# Patient Record
Sex: Male | Born: 1950 | Race: White | Hispanic: No | Marital: Married | Smoking: Never smoker
Health system: Southern US, Community
[De-identification: ages and names within clinical notes are randomized; demographics above are authoritative.]

## PROBLEM LIST (undated history)

## (undated) DIAGNOSIS — E119 Type 2 diabetes mellitus without complications: Secondary | ICD-10-CM

## (undated) DIAGNOSIS — E785 Hyperlipidemia, unspecified: Secondary | ICD-10-CM

## (undated) DIAGNOSIS — M1990 Unspecified osteoarthritis, unspecified site: Secondary | ICD-10-CM

## (undated) DIAGNOSIS — I10 Essential (primary) hypertension: Secondary | ICD-10-CM

## (undated) DIAGNOSIS — K760 Fatty (change of) liver, not elsewhere classified: Secondary | ICD-10-CM

## (undated) HISTORY — DX: Hyperlipidemia, unspecified: E78.5

## (undated) HISTORY — DX: Unspecified osteoarthritis, unspecified site: M19.90

## (undated) HISTORY — DX: Essential (primary) hypertension: I10

## (undated) HISTORY — DX: Fatty (change of) liver, not elsewhere classified: K76.0

## (undated) HISTORY — DX: Type 2 diabetes mellitus without complications: E11.9

---
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
## 2009-05-28 DIAGNOSIS — K760 Fatty (change of) liver, not elsewhere classified: Secondary | ICD-10-CM

## 2009-05-28 HISTORY — DX: Fatty (change of) liver, not elsewhere classified: K76.0

## 2012-03-25 ENCOUNTER — Ambulatory Visit: Payer: Self-pay | Admitting: Internal Medicine

## 2012-06-09 ENCOUNTER — Encounter: Payer: Self-pay | Admitting: Internal Medicine

## 2012-06-09 ENCOUNTER — Ambulatory Visit (INDEPENDENT_AMBULATORY_CARE_PROVIDER_SITE_OTHER): Payer: 59 | Admitting: Internal Medicine

## 2012-06-09 ENCOUNTER — Other Ambulatory Visit (INDEPENDENT_AMBULATORY_CARE_PROVIDER_SITE_OTHER): Payer: 59

## 2012-06-09 ENCOUNTER — Ambulatory Visit (INDEPENDENT_AMBULATORY_CARE_PROVIDER_SITE_OTHER)
Admission: RE | Admit: 2012-06-09 | Discharge: 2012-06-09 | Disposition: A | Discharge status: Home / Self Care | Payer: 59 | Source: Ambulatory Visit | Attending: Internal Medicine | Admitting: Internal Medicine

## 2012-06-09 VITALS — BP 144/90 | HR 86 | Temp 97.8°F | Resp 16 | Ht 72.0 in | Wt 250.0 lb

## 2012-06-09 DIAGNOSIS — I10 Essential (primary) hypertension: Secondary | ICD-10-CM | POA: Insufficient documentation

## 2012-06-09 DIAGNOSIS — M25561 Pain in right knee: Secondary | ICD-10-CM

## 2012-06-09 DIAGNOSIS — M25569 Pain in unspecified knee: Secondary | ICD-10-CM

## 2012-06-09 DIAGNOSIS — Z0000 Encounter for general adult medical examination without abnormal findings: Secondary | ICD-10-CM

## 2012-06-09 LAB — CBC WITH DIFFERENTIAL/PLATELET
Basophils Relative: 0.5 % (ref 0.0–3.0)
Eosinophils Relative: 1.7 % (ref 0.0–5.0)
HCT: 47.2 % (ref 39.0–52.0)
Hemoglobin: 16.1 g/dL (ref 13.0–17.0)
Lymphs Abs: 1.2 10*3/uL (ref 0.7–4.0)
MCV: 89.2 fl (ref 78.0–100.0)
Monocytes Absolute: 0.7 10*3/uL (ref 0.1–1.0)
Monocytes Relative: 9.7 % (ref 3.0–12.0)
RBC: 5.29 Mil/uL (ref 4.22–5.81)
WBC: 7.4 10*3/uL (ref 4.5–10.5)

## 2012-06-09 LAB — URINALYSIS, ROUTINE W REFLEX MICROSCOPIC
Bilirubin Urine: NEGATIVE
Nitrite: NEGATIVE
Total Protein, Urine: NEGATIVE
pH: 6.5 (ref 5.0–8.0)

## 2012-06-09 LAB — COMPREHENSIVE METABOLIC PANEL
Alkaline Phosphatase: 80 U/L (ref 39–117)
BUN: 14 mg/dL (ref 6–23)
Creatinine, Ser: 1 mg/dL (ref 0.4–1.5)
GFR: 82.82 mL/min (ref >60.00)
Glucose, Bld: 84 mg/dL (ref 70–99)
Sodium: 139 mEq/L (ref 135–145)
Total Bilirubin: 0.9 mg/dL (ref 0.3–1.2)

## 2012-06-09 LAB — LIPID PANEL
Cholesterol: 175 mg/dL (ref 0–200)
HDL: 51.7 mg/dL (ref >39.00)
Total CHOL/HDL Ratio: 3
Triglycerides: 224 mg/dL — ABNORMAL HIGH (ref 0.0–149.0)
VLDL: 44.8 mg/dL — ABNORMAL HIGH (ref 0.0–40.0)

## 2012-06-09 MED ORDER — ASPIRIN 81 MG PO TABS: 81.0000 mg | ORAL_TABLET | Freq: Every day | ORAL | Status: AC

## 2012-06-09 MED ORDER — LISINOPRIL-HYDROCHLOROTHIAZIDE 20-12.5 MG PO TABS: 1.0000 | ORAL_TABLET | Freq: Every day | ORAL | Status: DC

## 2012-06-09 MED ORDER — AMLODIPINE BESYLATE 10 MG PO TABS: 10.0000 mg | ORAL_TABLET | Freq: Every day | ORAL | Status: DC

## 2012-06-09 MED ORDER — GLUCOSE BLOOD VI STRP: ORAL_STRIP | Status: DC

## 2012-06-09 NOTE — Assessment & Plan Note (Signed)
I will check his a1c today and will treat if needed He was referred for an eye examd

## 2012-06-09 NOTE — Assessment & Plan Note (Signed)
He has adequate BP control I will check his lytes and renal function today

## 2012-06-09 NOTE — Patient Instructions (Addendum)
Knee Pain The knee is the complex joint between your thigh and your lower leg. It is made up of bones, tendons, ligaments, and cartilage. The bones that make up the knee are:  The femur in the thigh.  The tibia and fibula in the lower leg.  The patella or kneecap riding in the groove on the lower femur. CAUSES  Knee pain is a common complaint with many causes. A few of these causes are:  Injury, such as:  A ruptured ligament or tendon injury.  Torn cartilage.  Medical conditions, such as:  Gout  Arthritis  Infections  Overuse, over training or overdoing a physical activity. Knee pain can be minor or severe. Knee pain can accompany debilitating injury. Minor knee problems often respond well to self-care measures or get well on their own. More serious injuries may need medical intervention or even surgery. SYMPTOMS The knee is complex. Symptoms of knee problems can vary widely. Some of the problems are:  Pain with movement and weight bearing.  Swelling and tenderness.  Buckling of the knee.  Inability to straighten or extend your knee.  Your knee locks and you cannot straighten it.  Warmth and redness with pain and fever.  Deformity or dislocation of the kneecap. DIAGNOSIS  Determining what is wrong may be very straight forward such as when there is an injury. It can also be challenging because of the complexity of the knee. Tests to make a diagnosis may include:  Your caregiver taking a history and doing a physical exam.  Routine X-rays can be used to rule out other problems. X-rays will not reveal a cartilage tear. Some injuries of the knee can be diagnosed by:  Arthroscopy a surgical technique by which a small video camera is inserted through tiny incisions on the sides of the knee. This procedure is used to examine and repair internal knee joint problems. Tiny instruments can be used during arthroscopy to repair the torn knee cartilage (meniscus).  Arthrography  is a radiology technique. A contrast liquid is directly injected into the knee joint. Internal structures of the knee joint then become visible on X-ray film.  An MRI scan is a non x-ray radiology procedure in which magnetic fields and a computer produce two- or three-dimensional images of the inside of the knee. Cartilage tears are often visible using an MRI scanner. MRI scans have largely replaced arthrography in diagnosing cartilage tears of the knee.  Blood work.  Examination of the fluid that helps to lubricate the knee joint (synovial fluid). This is done by taking a sample out using a needle and a syringe. TREATMENT The treatment of knee problems depends on the cause. Some of these treatments are:  Depending on the injury, proper casting, splinting, surgery or physical therapy care will be needed.  Give yourself adequate recovery time. Do not overuse your joints. If you begin to get sore during workout routines, back off. Slow down or do fewer repetitions.  For repetitive activities such as cycling or running, maintain your strength and nutrition.  Alternate muscle groups. For example if you are a weight lifter, work the upper body on one day and the lower body the next.  Either tight or weak muscles do not give the proper support for your knee. Tight or weak muscles do not absorb the stress placed on the knee joint. Keep the muscles surrounding the knee strong.  Take care of mechanical problems.  If you have flat feet, orthotics or special shoes may help.   See your caregiver if you need help.  Arch supports, sometimes with wedges on the inner or outer aspect of the heel, can help. These can shift pressure away from the side of the knee most bothered by osteoarthritis.  A brace called an "unloader" brace also may be used to help ease the pressure on the most arthritic side of the knee.  If your caregiver has prescribed crutches, braces, wraps or ice, use as directed. The acronym for  this is PRICE. This means protection, rest, ice, compression and elevation.  Nonsteroidal anti-inflammatory drugs (NSAID's), can help relieve pain. But if taken immediately after an injury, they may actually increase swelling. Take NSAID's with food in your stomach. Stop them if you develop stomach problems. Do not take these if you have a history of ulcers, stomach pain or bleeding from the bowel. Do not take without your caregiver's approval if you have problems with fluid retention, heart failure, or kidney problems.  For ongoing knee problems, physical therapy may be helpful.  Glucosamine and chondroitin are over-the-counter dietary supplements. Both may help relieve the pain of osteoarthritis in the knee. These medicines are different from the usual anti-inflammatory drugs. Glucosamine may decrease the rate of cartilage destruction.  Injections of a corticosteroid drug into your knee joint may help reduce the symptoms of an arthritis flare-up. They may provide pain relief that lasts a few months. You may have to wait a few months between injections. The injections do have a small increased risk of infection, water retention and elevated blood sugar levels.  Hyaluronic acid injected into damaged joints may ease pain and provide lubrication. These injections may work by reducing inflammation. A series of shots may give relief for as long as 6 months.  Topical painkillers. Applying certain ointments to your skin may help relieve the pain and stiffness of osteoarthritis. Ask your pharmacist for suggestions. Many over the-counter products are approved for temporary relief of arthritis pain.  In some countries, doctors often prescribe topical NSAID's for relief of chronic conditions such as arthritis and tendinitis. A review of treatment with NSAID creams found that they worked as well as oral medications but without the serious side effects. PREVENTION  Maintain a healthy weight. Extra pounds put  more strain on your joints.  Get strong, stay limber. Weak muscles are a common cause of knee injuries. Stretching is important. Include flexibility exercises in your workouts.  Be smart about exercise. If you have osteoarthritis, chronic knee pain or recurring injuries, you may need to change the way you exercise. This does not mean you have to stop being active. If your knees ache after jogging or playing basketball, consider switching to swimming, water aerobics or other low-impact activities, at least for a few days a week. Sometimes limiting high-impact activities will provide relief.  Make sure your shoes fit well. Choose footwear that is right for your sport.  Protect your knees. Use the proper gear for knee-sensitive activities. Use kneepads when playing volleyball or laying carpet. Buckle your seat belt every time you drive. Most shattered kneecaps occur in car accidents.  Rest when you are tired. SEEK MEDICAL CARE IF:  You have knee pain that is continual and does not seem to be getting better.  SEEK IMMEDIATE MEDICAL CARE IF:  Your knee joint feels hot to the touch and you have a high fever. MAKE SURE YOU:   Understand these instructions.  Will watch your condition.  Will get help right away if you are not   doing well or get worse. Document Released: 03/11/2007 Document Revised: 08/06/2011 Document Reviewed: 03/11/2007 [facility] Patient Information 2013 [place], [place]. Health Maintenance, Males A healthy lifestyle and preventative care can promote health and wellness.  Maintain regular health, dental, and eye exams.  Eat a healthy diet. Foods like vegetables, fruits, whole grains, low-fat dairy products, and lean protein foods contain the nutrients you need without too many calories. Decrease your intake of foods high in solid fats, added sugars, and salt. Get information about a proper diet from your caregiver, if necessary.  Regular physical exercise is one of the most  important things you can do for your health. Most adults should get at least 150 minutes of moderate-intensity exercise (any activity that increases your heart rate and causes you to sweat) each week. In addition, most adults need muscle-strengthening exercises on 2 or more days a week.   Maintain a healthy weight. The body mass index (BMI) is a screening tool to identify possible weight problems. It provides an estimate of body fat based on height and weight. Your caregiver can help determine your BMI, and can help you achieve or maintain a healthy weight. For adults 20 years and older:  A BMI below 18.5 is considered underweight.  A BMI of 18.5 to 24.9 is normal.  A BMI of 25 to 29.9 is considered overweight.  A BMI of 30 and above is considered obese.  Maintain normal blood lipids and cholesterol by exercising and minimizing your intake of saturated fat. Eat a balanced diet with plenty of fruits and vegetables. Blood tests for lipids and cholesterol should begin at age 91 and be repeated every 5 years. If your lipid or cholesterol levels are high, you are over 50, or you are a high risk for heart disease, you may need your cholesterol levels checked more frequently.Ongoing high lipid and cholesterol levels should be treated with medicines, if diet and exercise are not effective.  If you smoke, find out from your caregiver how to quit. If you do not use tobacco, do not start.  If you choose to drink alcohol, do not exceed 2 drinks per day. One drink is considered to be 12 ounces (355 mL) of beer, 5 ounces (148 mL) of wine, or 1.5 ounces (44 mL) of liquor.  Avoid use of street drugs. Do not share needles with anyone. Ask for help if you need support or instructions about stopping the use of drugs.  High blood pressure causes heart disease and increases the risk of stroke. Blood pressure should be checked at least every 1 to 2 years. Ongoing high blood pressure should be treated with medicines  if weight loss and exercise are not effective.  If you are 68 to 61 years old, ask your caregiver if you should take aspirin to prevent heart disease.  Diabetes screening involves taking a blood sample to check your fasting blood sugar level. This should be done once every 3 years, after age 63, if you are within normal weight and without risk factors for diabetes. Testing should be considered at a younger age or be carried out more frequently if you are overweight and have at least 1 risk factor for diabetes.  Colorectal cancer can be detected and often prevented. Most routine colorectal cancer screening begins at the age of 58 and continues through age 45. However, your caregiver may recommend screening at an earlier age if you have risk factors for colon cancer. On a yearly basis, your caregiver may provide home  test kits to check for hidden blood in the stool. Use of a small camera at the end of a tube, to directly examine the colon (sigmoidoscopy or colonoscopy), can detect the earliest forms of colorectal cancer. Talk to your caregiver about this at age 44, when routine screening begins. Direct examination of the colon should be repeated every 5 to 10 years through age 7, unless early forms of pre-cancerous polyps or small growths are found.  Hepatitis C blood testing is recommended for all people born from [birth year] through 1964 and any individual with known risks for hepatitis C.  Healthy men should no longer receive prostate-specific antigen (PSA) blood tests as part of routine cancer screening. Consult with your caregiver about prostate cancer screening.  Testicular cancer screening is not recommended for adolescents or adult males who have no symptoms. Screening includes self-exam, caregiver exam, and other screening tests. Consult with your caregiver about any symptoms you have or any concerns you have about testicular cancer.  Practice safe sex. Use condoms and avoid high-risk sexual practices  to reduce the spread of sexually transmitted infections (STIs).  Use sunscreen with a sun protection factor (SPF) of 30 or greater. Apply sunscreen liberally and repeatedly throughout the day. You should seek shade when your shadow is shorter than you. Protect yourself by wearing long sleeves, pants, a wide-brimmed hat, and sunglasses year round, whenever you are outdoors.  Notify your caregiver of new moles or changes in moles, especially if there is a change in shape or color. Also notify your caregiver if a mole is larger than the size of a pencil eraser.  A one-time screening for abdominal aortic aneurysm (AAA) and surgical repair of large AAAs by sound wave imaging (ultrasonography) is recommended for ages 61 to 61 years who are current or former smokers.  Stay current with your immunizations. Document Released: 11/10/2007 Document Revised: 08/06/2011 Document Reviewed: 10/09/2010 [facility] Patient Information 2013 [place], [place].

## 2012-06-09 NOTE — Assessment & Plan Note (Signed)
He refused ALL vaccines today Exam done Labs ordered He was referred for a screening colonoscopy Pt ed material was given

## 2012-06-09 NOTE — Progress Notes (Signed)
Subjective:    Patient ID: Robert Fuentes, male    DOB: November 17, 1950, 62 y.o.   MRN: [id number]  Knee Pain  Incident onset: for 2 months. The incident occurred at home. There was no injury mechanism. The pain is present in the right knee. The quality of the pain is described as aching. The pain is at a severity of 2/10. The pain is mild. The pain has been intermittent since onset. Pertinent negatives include no inability to bear weight, loss of motion, loss of sensation, muscle weakness, numbness or tingling. He reports no foreign bodies present. The symptoms are aggravated by weight bearing. He has tried NSAIDs and acetaminophen for the symptoms. The treatment provided significant relief.  Hypertension This is a chronic problem. The current episode started more than 1 year ago. The problem is controlled. Pertinent negatives include no anxiety, blurred vision, chest pain, headaches, malaise/fatigue, neck pain, orthopnea, palpitations, peripheral edema, PND, shortness of breath or sweats. Agents associated with hypertension include NSAIDs. Past treatments include calcium channel blockers, ACE inhibitors and diuretics. The current treatment provides moderate improvement. Compliance problems include exercise and diet.       Review of Systems  Constitutional: Negative.  Negative for malaise/fatigue.  HENT: Negative.  Negative for neck pain.   Eyes: Negative.  Negative for blurred vision.  Respiratory: Negative for apnea, cough, choking, chest tightness, shortness of breath, wheezing and stridor.   Cardiovascular: Negative for chest pain, palpitations, orthopnea, leg swelling and PND.  Gastrointestinal: Negative for nausea, vomiting, abdominal pain, diarrhea, constipation and blood in stool.  Genitourinary: Negative.   Musculoskeletal: Positive for arthralgias. Negative for myalgias, back pain, joint swelling and gait problem.  Skin: Negative.   Neurological: Negative for tingling, numbness and  headaches.  Hematological: Negative for adenopathy. Does not bruise/bleed easily.  Psychiatric/Behavioral: Negative.        Objective:   Physical Exam  Vitals reviewed. Constitutional: He is oriented to person, place, and time. He appears well-developed and well-nourished. No distress.  HENT:  Head: Normocephalic and atraumatic.  Mouth/Throat: Oropharynx is clear and moist. No oropharyngeal exudate.  Eyes: Conjunctivae normal are normal. Right eye exhibits no discharge. Left eye exhibits no discharge. No scleral icterus.  Neck: Normal range of motion. Neck supple. No JVD present. No tracheal deviation present. No thyromegaly present.  Cardiovascular: Normal rate, regular rhythm, normal heart sounds and intact distal pulses.  Exam reveals no gallop and no friction rub.   No murmur heard. Pulmonary/Chest: Effort normal and breath sounds normal. No stridor. No respiratory distress. He has no wheezes. He has no rales. He exhibits no tenderness.  Abdominal: Soft. Bowel sounds are normal. He exhibits no distension and no mass. There is no tenderness. There is no rebound and no guarding. Hernia confirmed negative in the right inguinal area and confirmed negative in the left inguinal area.  Genitourinary: Rectum normal, testes normal and penis normal. Rectal exam shows no external hemorrhoid, no internal hemorrhoid, no fissure, no mass, no tenderness and anal tone normal. Guaiac negative stool. Prostate is enlarged (1+ smooth symmetrical BPH). Prostate is not tender. Right testis shows no mass, no swelling and no tenderness. Right testis is descended. Left testis shows no mass, no swelling and no tenderness. Left testis is descended. Circumcised. No penile erythema or penile tenderness. No discharge found.  Musculoskeletal: Normal range of motion. He exhibits edema (1+ pitting edema in BLE). He exhibits no tenderness.       Right knee: He exhibits normal range of motion,  no swelling, no effusion, no  ecchymosis, no deformity, no laceration, no erythema, normal alignment, no LCL laxity, normal patellar mobility, no bony tenderness, normal meniscus and no MCL laxity. tenderness found. Medial joint line tenderness noted. No lateral joint line, no MCL, no LCL and no patellar tendon tenderness noted.  Lymphadenopathy:    He has no cervical adenopathy.       Right: No inguinal adenopathy present.       Left: No inguinal adenopathy present.  Neurological: He is oriented to person, place, and time.  Skin: Skin is warm and dry. No rash noted. He is not diaphoretic. No erythema. No pallor.  Psychiatric: He has a normal mood and affect. His behavior is normal. Judgment and thought content normal.      No results found for this basename: WBC, HGB, HCT, PLT, GLUCOSE, CHOL, TRIG, HDL, LDLDIRECT, LDLCALC, ALT, AST, NA, K, CL, CREATININE, BUN, CO2, TSH, PSA, INR, GLUF, HGBA1C, MICROALBUR      Assessment & Plan:

## 2012-06-09 NOTE — Assessment & Plan Note (Signed)
The exam shows concern for the medial meniscus area I will check a plain film to see if there is DJD He will continue the current meds for pain

## 2012-06-10 ENCOUNTER — Encounter: Payer: Self-pay | Admitting: Internal Medicine

## 2012-06-17 ENCOUNTER — Encounter: Payer: Self-pay | Admitting: Internal Medicine

## 2012-06-19 MED ORDER — ACCU-CHEK SOFTCLIX LANCETS MISC: Status: DC

## 2012-06-19 MED ORDER — ACCU-CHEK NANO SMARTVIEW W/DEVICE KIT: 1.0000 | PACK | Freq: Three times a day (TID) | Status: DC

## 2012-10-29 ENCOUNTER — Ambulatory Visit (INDEPENDENT_AMBULATORY_CARE_PROVIDER_SITE_OTHER): Payer: 59 | Admitting: Internal Medicine

## 2012-10-29 ENCOUNTER — Encounter: Payer: Self-pay | Admitting: Internal Medicine

## 2012-10-29 ENCOUNTER — Other Ambulatory Visit (INDEPENDENT_AMBULATORY_CARE_PROVIDER_SITE_OTHER): Payer: 59

## 2012-10-29 VITALS — BP 140/86 | HR 86 | Temp 97.7°F | Resp 16 | Wt 254.5 lb

## 2012-10-29 DIAGNOSIS — M6530 Trigger finger, unspecified finger: Secondary | ICD-10-CM

## 2012-10-29 DIAGNOSIS — I10 Essential (primary) hypertension: Secondary | ICD-10-CM

## 2012-10-29 DIAGNOSIS — M25561 Pain in right knee: Secondary | ICD-10-CM

## 2012-10-29 DIAGNOSIS — M25569 Pain in unspecified knee: Secondary | ICD-10-CM

## 2012-10-29 LAB — BASIC METABOLIC PANEL
CO2: 27 mEq/L (ref 19–32)
Glucose, Bld: 95 mg/dL (ref 70–99)
Potassium: 4.2 mEq/L (ref 3.5–5.1)
Sodium: 142 mEq/L (ref 135–145)

## 2012-10-29 MED ORDER — AMLODIPINE BESYLATE 10 MG PO TABS: 10.0000 mg | ORAL_TABLET | Freq: Every day | ORAL | Status: DC

## 2012-10-29 MED ORDER — LISINOPRIL-HYDROCHLOROTHIAZIDE 20-12.5 MG PO TABS: 1.0000 | ORAL_TABLET | Freq: Every day | ORAL | Status: DC

## 2012-10-29 NOTE — Progress Notes (Signed)
Subjective:    Patient ID: Robert Fuentes, male    DOB: June 16, 1950, 62 y.o.   MRN: [id number]  Arthritis Presents for follow-up visit. He complains of pain and joint swelling. He reports no stiffness or joint warmth. Affected locations include the right knee. His pain is at a severity of 2/10. Pertinent negatives include no fatigue or fever.      Review of Systems  Constitutional: Negative.  Negative for fever, chills, diaphoresis, activity change, appetite change, fatigue and unexpected weight change.  HENT: Negative.   Eyes: Negative.   Respiratory: Negative.  Negative for cough, chest tightness, shortness of breath, wheezing and stridor.   Cardiovascular: Negative.  Negative for chest pain, palpitations and leg swelling.  Gastrointestinal: Negative.  Negative for abdominal pain.  Endocrine: Negative.   Genitourinary: Negative.   Musculoskeletal: Positive for joint swelling, arthralgias and arthritis. Negative for myalgias, back pain, gait problem and stiffness.       Trigger mechanism in left hand middle finger  Skin: Negative.   Allergic/Immunologic: Negative.   Neurological: Negative.   Hematological: Negative.  Negative for adenopathy. Does not bruise/bleed easily.  Psychiatric/Behavioral: Negative.        Objective:   Physical Exam  Vitals reviewed. Constitutional: He is oriented to person, place, and time. He appears well-developed and well-nourished. No distress.  HENT:  Head: Normocephalic and atraumatic.  Mouth/Throat: Oropharynx is clear and moist. No oropharyngeal exudate.  Eyes: Conjunctivae are normal. Right eye exhibits no discharge. Left eye exhibits no discharge. No scleral icterus.  Neck: Normal range of motion. Neck supple. No JVD present. No tracheal deviation present. No thyromegaly present.  Cardiovascular: Normal rate, regular rhythm, normal heart sounds and intact distal pulses.  Exam reveals no gallop and no friction rub.   No murmur  heard. Pulmonary/Chest: Effort normal and breath sounds normal. No stridor. No respiratory distress. He has no wheezes. He has no rales. He exhibits no tenderness.  Abdominal: Soft. Bowel sounds are normal. He exhibits no distension and no mass. There is no tenderness. There is no rebound and no guarding.  Musculoskeletal: Normal range of motion. He exhibits no edema and no tenderness.       Right knee: He exhibits swelling. He exhibits normal range of motion, no effusion, no ecchymosis, no deformity, no laceration, no erythema, normal alignment, no LCL laxity and normal patellar mobility. Tenderness found. Lateral joint line tenderness noted. No medial joint line and no MCL tenderness noted.  +cyst in LIF and trigger in LMF  Lymphadenopathy:    He has no cervical adenopathy.  Neurological: He is oriented to person, place, and time.  Skin: Skin is warm and dry. No rash noted. He is not diaphoretic. No erythema. No pallor.  Psychiatric: He has a normal mood and affect. His behavior is normal. Judgment and thought content normal.     Lab Results  Component Value Date   WBC 7.4 06/09/2012   HGB 16.1 06/09/2012   HCT 47.2 06/09/2012   PLT 128.0* 06/09/2012   GLUCOSE 84 06/09/2012   CHOL 175 06/09/2012   TRIG 224.0* 06/09/2012   HDL 51.70 06/09/2012   LDLDIRECT 82.8 06/09/2012   ALT 40 06/09/2012   AST 32 06/09/2012   NA 139 06/09/2012   K 3.8 06/09/2012   CL 102 06/09/2012   CREATININE 1.0 06/09/2012   BUN 14 06/09/2012   CO2 30 06/09/2012   TSH 0.90 06/09/2012   PSA 2.11 06/09/2012   HGBA1C 5.6 06/09/2012  Assessment & Plan:

## 2012-10-29 NOTE — Assessment & Plan Note (Signed)
His BP is well controlled Today I will check his lytes and renal function 

## 2012-10-29 NOTE — Assessment & Plan Note (Signed)
Ortho referral  

## 2012-10-29 NOTE — Patient Instructions (Signed)

## 2013-04-02 ENCOUNTER — Other Ambulatory Visit: Payer: Self-pay

## 2013-05-11 ENCOUNTER — Other Ambulatory Visit: Payer: Self-pay | Admitting: Internal Medicine

## 2013-07-28 ENCOUNTER — Encounter: Payer: Self-pay | Admitting: Internal Medicine

## 2013-07-28 ENCOUNTER — Ambulatory Visit (INDEPENDENT_AMBULATORY_CARE_PROVIDER_SITE_OTHER): Payer: 59 | Admitting: Internal Medicine

## 2013-07-28 VITALS — BP 140/70 | HR 90 | Temp 97.8°F | Resp 16 | Ht 72.0 in | Wt 253.0 lb

## 2013-07-28 DIAGNOSIS — IMO0001 Reserved for inherently not codable concepts without codable children: Secondary | ICD-10-CM

## 2013-07-28 DIAGNOSIS — I10 Essential (primary) hypertension: Secondary | ICD-10-CM

## 2013-07-28 DIAGNOSIS — E781 Pure hyperglyceridemia: Secondary | ICD-10-CM

## 2013-07-28 DIAGNOSIS — Z0000 Encounter for general adult medical examination without abnormal findings: Secondary | ICD-10-CM

## 2013-07-28 DIAGNOSIS — E1165 Type 2 diabetes mellitus with hyperglycemia: Principal | ICD-10-CM

## 2013-07-28 MED ORDER — VITAMIN E 180 MG (400 UNIT) PO CAPS: 400.0000 [IU] | ORAL_CAPSULE | Freq: Every day | ORAL | Status: AC

## 2013-07-28 MED ORDER — LISINOPRIL-HYDROCHLOROTHIAZIDE 20-12.5 MG PO TABS: ORAL_TABLET | ORAL | Status: AC

## 2013-07-28 MED ORDER — AMLODIPINE BESYLATE 10 MG PO TABS: ORAL_TABLET | ORAL | Status: DC

## 2013-07-28 MED ORDER — GLUCOSE BLOOD VI STRP: ORAL_STRIP | Status: DC

## 2013-07-28 NOTE — Progress Notes (Signed)
Subjective:    Patient ID: Robert Fuentes, male    DOB: 06/15/1950, 63 y.o.   MRN: [id number]  Hypertension This is a chronic problem. The current episode started more than 1 year ago. The problem is unchanged. The problem is controlled. Pertinent negatives include no anxiety, blurred vision, chest pain, headaches, malaise/fatigue, neck pain, orthopnea, palpitations, peripheral edema, PND, shortness of breath or sweats. Past treatments include calcium channel blockers, ACE inhibitors and diuretics. The current treatment provides moderate improvement. Compliance problems include diet and exercise.       Review of Systems  Constitutional: Negative.  Negative for fever, chills, malaise/fatigue, diaphoresis, appetite change and fatigue.  HENT: Negative.   Eyes: Negative.  Negative for blurred vision.  Respiratory: Negative.  Negative for cough, choking, chest tightness, shortness of breath and stridor.   Cardiovascular: Negative.  Negative for chest pain, palpitations, orthopnea, leg swelling and PND.  Gastrointestinal: Negative.  Negative for nausea, vomiting, abdominal pain, diarrhea, constipation and blood in stool.  Endocrine: Negative.   Genitourinary: Negative.   Musculoskeletal: Negative.  Negative for arthralgias, myalgias, neck pain and neck stiffness.  Skin: Negative.   Allergic/Immunologic: Negative.   Neurological: Negative.  Negative for dizziness, weakness and headaches.  Hematological: Negative.  Negative for adenopathy. Does not bruise/bleed easily.  Psychiatric/Behavioral: Negative.        Objective:   Physical Exam  Vitals reviewed. Constitutional: He is oriented to person, place, and time. He appears well-developed and well-nourished. No distress.  HENT:  Head: Normocephalic and atraumatic.  Mouth/Throat: Oropharynx is clear and moist. No oropharyngeal exudate.  Eyes: Conjunctivae are normal. Right eye exhibits no discharge. Left eye exhibits no discharge. No scleral  icterus.  Neck: Normal range of motion. Neck supple. No JVD present. No tracheal deviation present. No thyromegaly present.  Cardiovascular: Normal rate, regular rhythm, normal heart sounds and intact distal pulses.  Exam reveals no gallop and no friction rub.   No murmur heard. Pulmonary/Chest: Effort normal and breath sounds normal. No stridor. No respiratory distress. He has no wheezes. He has no rales. He exhibits no tenderness.  Abdominal: Soft. Bowel sounds are normal. He exhibits no distension and no mass. There is no tenderness. There is no rebound and no guarding. Hernia confirmed negative in the right inguinal area and confirmed negative in the left inguinal area.  Genitourinary: Rectum normal, testes normal and penis normal. Rectal exam shows no external hemorrhoid, no internal hemorrhoid, no fissure, no mass, no tenderness and anal tone normal. Guaiac negative stool. Prostate is enlarged (1+ smooth BPH). Prostate is not tender. Right testis shows no mass, no swelling and no tenderness. Right testis is descended. Left testis shows no mass, no swelling and no tenderness. Left testis is descended. Circumcised. No penile erythema or penile tenderness. No discharge found.  Musculoskeletal: Normal range of motion. He exhibits no edema and no tenderness.  Lymphadenopathy:    He has no cervical adenopathy.       Right: No inguinal adenopathy present.       Left: No inguinal adenopathy present.  Neurological: He is oriented to person, place, and time.  Skin: Skin is warm and dry. No rash noted. He is not diaphoretic. No erythema. No pallor.  Psychiatric: He has a normal mood and affect. His behavior is normal. Judgment and thought content normal.     Lab Results  Component Value Date   WBC 7.4 06/09/2012   HGB 16.1 06/09/2012   HCT 47.2 06/09/2012   PLT 128.0*  06/09/2012   GLUCOSE 95 10/29/2012   CHOL 175 06/09/2012   TRIG 224.0* 06/09/2012   HDL 51.70 06/09/2012   LDLDIRECT 82.8 06/09/2012    ALT 40 06/09/2012   AST 32 06/09/2012   NA 142 10/29/2012   K 4.2 10/29/2012   CL 104 10/29/2012   CREATININE 1.0 10/29/2012   BUN 14 10/29/2012   CO2 27 10/29/2012   TSH 0.90 06/09/2012   PSA 2.11 06/09/2012   HGBA1C 5.6 06/09/2012       Assessment & Plan:

## 2013-07-28 NOTE — Patient Instructions (Signed)
Health Maintenance, Males A healthy lifestyle and preventative care can promote health and wellness.  Maintain regular health, dental, and eye exams.  Eat a healthy diet. Foods like vegetables, fruits, whole grains, low-fat dairy products, and lean protein foods contain the nutrients you need and are low in calories. Decrease your intake of foods high in solid fats, added sugars, and salt. Get information about a proper diet from your health care provider, if necessary.  Regular physical exercise is one of the most important things you can do for your health. Most adults should get at least 150 minutes of moderate-intensity exercise (any activity that increases your heart rate and causes you to sweat) each week. In addition, most adults need muscle-strengthening exercises on 2 or more days a week.   Maintain a healthy weight. The body mass index (BMI) is a screening tool to identify possible weight problems. It provides an estimate of body fat based on height and weight. Your health care provider can find your BMI and can help you achieve or maintain a healthy weight. For males 20 years and older:  A BMI below 18.5 is considered underweight.  A BMI of 18.5 to 24.9 is normal.  A BMI of 25 to 29.9 is considered overweight.  A BMI of 30 and above is considered obese.  Maintain normal blood lipids and cholesterol by exercising and minimizing your intake of saturated fat. Eat a balanced diet with plenty of fruits and vegetables. Blood tests for lipids and cholesterol should begin at age 20 and be repeated every 5 years. If your lipid or cholesterol levels are high, you are over 50, or you are at high risk for heart disease, you may need your cholesterol levels checked more frequently.Ongoing high lipid and cholesterol levels should be treated with medicines, if diet and exercise are not working.  If you smoke, find out from your health care provider how to quit. If you do not use tobacco, do not  start.  Lung cancer screening is recommended for adults aged 55 80 years who are at high risk for developing lung cancer because of a history of smoking. A yearly low-dose CT scan of the lungs is recommended for people who have at least a 30-pack-year history of smoking and are a current smoker or have quit within the past 15 years. A pack year of smoking is smoking an average of 1 pack of cigarettes a day for 1 year (for example, a 30-pack-year history of smoking could mean smoking 1 pack a day for 30 years or 2 packs a day for 15 years). Yearly screening should continue until the smoker has stopped smoking for at least 15 years. Yearly screening should be stopped for people who develop a health problem that would prevent them from having lung cancer treatment.  If you choose to drink alcohol, do not have more than 2 drinks per day. One drink is considered to be 12 oz (360 mL) of beer, 5 oz (150 mL) of wine, or 1.5 oz (45 mL) of liquor.  Avoid use of street drugs. Do not share needles with anyone. Ask for help if you need support or instructions about stopping the use of drugs.  High blood pressure causes heart disease and increases the risk of stroke. Blood pressure should be checked at least every 1 2 years. Ongoing high blood pressure should be treated with medicines if weight loss and exercise are not effective.  If you are 45 62 years old, ask your health   care provider if you should take aspirin to prevent heart disease.  Diabetes screening involves taking a blood sample to check your fasting blood sugar level. This should be done once every 3 years after age 45, if you are at a normal weight and without risk factors for diabetes. Testing should be considered at a younger age or be carried out more frequently if you are overweight and have at least 1 risk factor for diabetes.  Colorectal cancer can be detected and often prevented. Most routine colorectal cancer screening begins at the age of 50  and continues through age 75. However, your health care provider may recommend screening at an earlier age if you have risk factors for colon cancer. On a yearly basis, your health care provider may provide home test kits to check for hidden blood in the stool. A small camera at the end of a tube may be used to directly examine the colon (sigmoidoscopy or colonoscopy) to detect the earliest forms of colorectal cancer. Talk to your health care provider about this at age 50, when routine screening begins. A direct exam of the colon should be repeated every 5 10 years through age 75, unless early forms of pre-cancerous polyps or small growths are found.  People who are at an increased risk for hepatitis B should be screened for this virus. You are considered at high risk for hepatitis B if:  You were born in a country where hepatitis B occurs often. Talk with your health care provider about which countries are considered high-risk.  Your parents were born in a high-risk country and you have not received a shot to protect against hepatitis B (hepatitis B vaccine).  You have HIV or AIDS.  You use needles to inject street drugs.  You live with, or have sex with, someone who has hepatitis B.  You are a man who has sex with other men (MSM).  You get hemodialysis treatment.  You take certain medicines for conditions like cancer, organ transplantation, and autoimmune conditions.  Hepatitis C blood testing is recommended for all people born from 1945 through 1965 and any individual with known risk factors for hepatitis C.  Healthy men should no longer receive prostate-specific antigen (PSA) blood tests as part of routine cancer screening. Talk to your health care provider about prostate cancer screening.  Testicular cancer screening is not recommended for adolescents or adult males who have no symptoms. Screening includes self-exam, a health care provider exam, and other screening tests. Consult with  your health care provider about any symptoms you have or any concerns you have about testicular cancer.  Practice safe sex. Use condoms and avoid high-risk sexual practices to reduce the spread of sexually transmitted infections (STIs).  Use sunscreen. Apply sunscreen liberally and repeatedly throughout the day. You should seek shade when your shadow is shorter than you. Protect yourself by wearing long sleeves, pants, a wide-brimmed hat, and sunglasses year round, whenever you are outdoors.  Tell your health care provider of new moles or changes in moles, especially if there is a change in shape or color. Also tell your provider if a mole is larger than the size of a pencil eraser.  A one-time screening for abdominal aortic aneurysm (AAA) and surgical repair of large AAAs by ultrasound is recommended for men aged 65 75 years who are current or former smokers.  Stay current with your vaccines (immunizations). Document Released: 11/10/2007 Document Revised: 03/04/2013 Document Reviewed: 10/09/2010 Exit[employer] Patient Information 2014 [employer].   Hypertension As your heart beats, it forces blood through your arteries. This force is your blood pressure. If the pressure is too high, it is called hypertension (HTN) or high blood pressure. HTN is dangerous because you may have it and not know it. High blood pressure may mean that your heart has to work harder to pump blood. Your arteries may be narrow or stiff. The extra work puts you at risk for heart disease, stroke, and other problems.  Blood pressure consists of two numbers, a higher number over a lower, 110/72, for example. It is stated as "110 over 72." The ideal is below 120 for the top number (systolic) and under 80 for the bottom (diastolic). Write down your blood pressure today. You should pay close attention to your blood pressure if you have certain conditions such as:  Heart failure.  Prior heart attack.  Diabetes  Chronic kidney  disease.  Prior stroke.  Multiple risk factors for heart disease. To see if you have HTN, your blood pressure should be measured while you are seated with your arm held at the level of the heart. It should be measured at least twice. A one-time elevated blood pressure reading (especially in the Emergency Department) does not mean that you need treatment. There may be conditions in which the blood pressure is different between your right and left arms. It is important to see your caregiver soon for a recheck. Most people have essential hypertension which means that there is not a specific cause. This type of high blood pressure may be lowered by changing lifestyle factors such as:  Stress.  Smoking.  Lack of exercise.  Excessive weight.  Drug/tobacco/alcohol use.  Eating less salt. Most people do not have symptoms from high blood pressure until it has caused damage to the body. Effective treatment can often prevent, delay or reduce that damage. TREATMENT  When a cause has been identified, treatment for high blood pressure is directed at the cause. There are a large number of medications to treat HTN. These fall into several categories, and your caregiver will help you select the medicines that are best for you. Medications may have side effects. You should review side effects with your caregiver. If your blood pressure stays high after you have made lifestyle changes or started on medicines,   Your medication(s) may need to be changed.  Other problems may need to be addressed.  Be certain you understand your prescriptions, and know how and when to take your medicine.  Be sure to follow up with your caregiver within the time frame advised (usually within two weeks) to have your blood pressure rechecked and to review your medications.  If you are taking more than one medicine to lower your blood pressure, make sure you know how and at what times they should be taken. Taking two medicines  at the same time can result in blood pressure that is too low. SEEK IMMEDIATE MEDICAL CARE IF:  You develop a severe headache, blurred or changing vision, or confusion.  You have unusual weakness or numbness, or a faint feeling.  You have severe chest or abdominal pain, vomiting, or breathing problems. MAKE SURE YOU:   Understand these instructions.  Will watch your condition.  Will get help right away if you are not doing well or get worse. Document Released: 05/14/2005 Document Revised: 08/06/2011 Document Reviewed: 01/02/2008 ExitCare Patient Information 2014 [employer].  

## 2013-07-28 NOTE — Progress Notes (Signed)
Pre visit review using our clinic review tool, if applicable. No additional management support is needed unless otherwise documented below in the visit note. 

## 2013-07-29 ENCOUNTER — Telehealth: Payer: Self-pay

## 2013-07-29 NOTE — Assessment & Plan Note (Signed)
He refused a flu vax Exam done He was referred for a colonoscopy Labs ordered Pt ed material was given

## 2013-07-29 NOTE — Assessment & Plan Note (Signed)
His BP is adequately well controlled though will work on his lifestyle modifications to get the SBP lower I will check his lytes and renal function

## 2013-07-29 NOTE — Telephone Encounter (Signed)
Relevant patient education assigned to patient using Emmi. ° °

## 2013-07-29 NOTE — Assessment & Plan Note (Signed)
He will cont to work on his lifestyle modifications I will recheck his FLP and if the trigs are > 500 will consider treating with a medication

## 2013-09-07 ENCOUNTER — Other Ambulatory Visit: Payer: Self-pay

## 2013-09-07 ENCOUNTER — Other Ambulatory Visit: Payer: Self-pay | Admitting: Internal Medicine

## 2013-09-07 MED ORDER — ACCU-CHEK AVIVA DEVI: Status: DC

## 2013-09-07 MED ORDER — GLUCOSE BLOOD VI STRP: ORAL_STRIP | Status: DC

## 2013-09-07 NOTE — Telephone Encounter (Signed)
rx changed and sent to pharmacy

## 2013-09-09 ENCOUNTER — Other Ambulatory Visit: Payer: Self-pay

## 2013-09-09 MED ORDER — ACCU-CHEK SOFTCLIX LANCETS MISC: Status: AC

## 2013-09-14 ENCOUNTER — Other Ambulatory Visit: Payer: Self-pay | Admitting: Internal Medicine

## 2013-09-14 MED ORDER — ACCU-CHEK AVIVA DEVI
Status: AC
Start: 2013-09-14 — End: 2014-09-14

## 2013-09-14 MED ORDER — GLUCOSE BLOOD VI STRP: ORAL_STRIP | Status: AC

## 2013-10-05 ENCOUNTER — Encounter: Payer: Self-pay | Admitting: Internal Medicine

## 2013-10-30 ENCOUNTER — Other Ambulatory Visit: Payer: Self-pay

## 2013-10-30 DIAGNOSIS — I10 Essential (primary) hypertension: Secondary | ICD-10-CM

## 2013-10-30 MED ORDER — AMLODIPINE BESYLATE 10 MG PO TABS: ORAL_TABLET | ORAL | Status: DC

## 2013-11-07 ENCOUNTER — Encounter: Payer: Self-pay | Admitting: Internal Medicine

## 2013-11-07 DIAGNOSIS — I10 Essential (primary) hypertension: Secondary | ICD-10-CM

## 2013-11-09 MED ORDER — AMLODIPINE BESYLATE 10 MG PO TABS: ORAL_TABLET | ORAL | Status: AC

## 2014-03-26 IMAGING — CR DG KNEE COMPLETE 4+V*R*
4 series · 4 of 4 positions shown · non-contrast
Comparison: None.

CLINICAL DATA: Knee pain

RIGHT KNEE - COMPLETE 4+ VIEW

[view not recorded (1 of 4)]
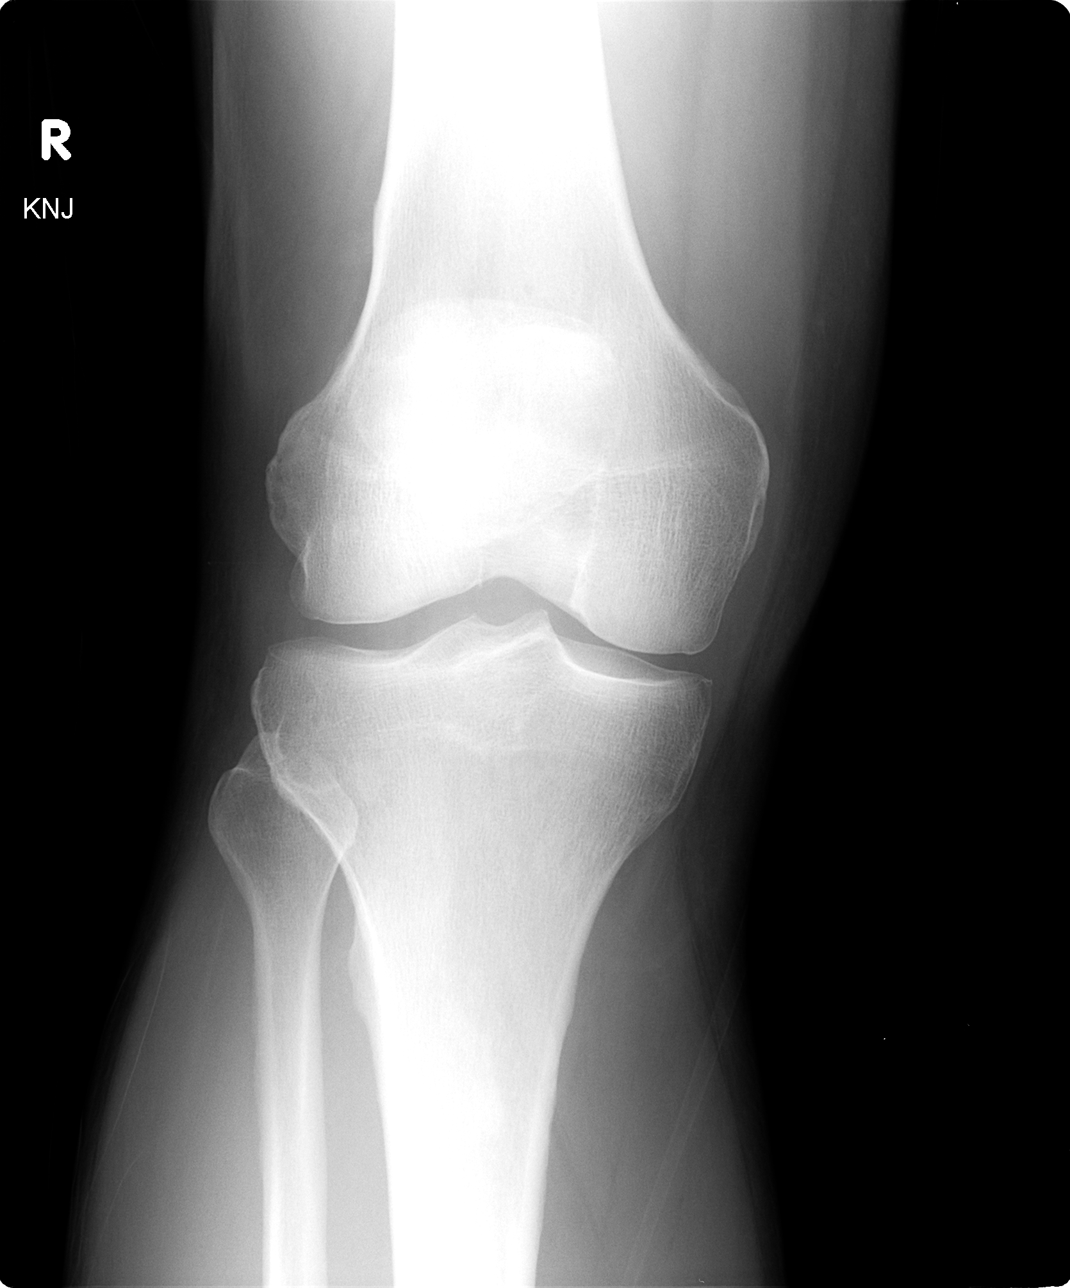

[view not recorded (2 of 4)]
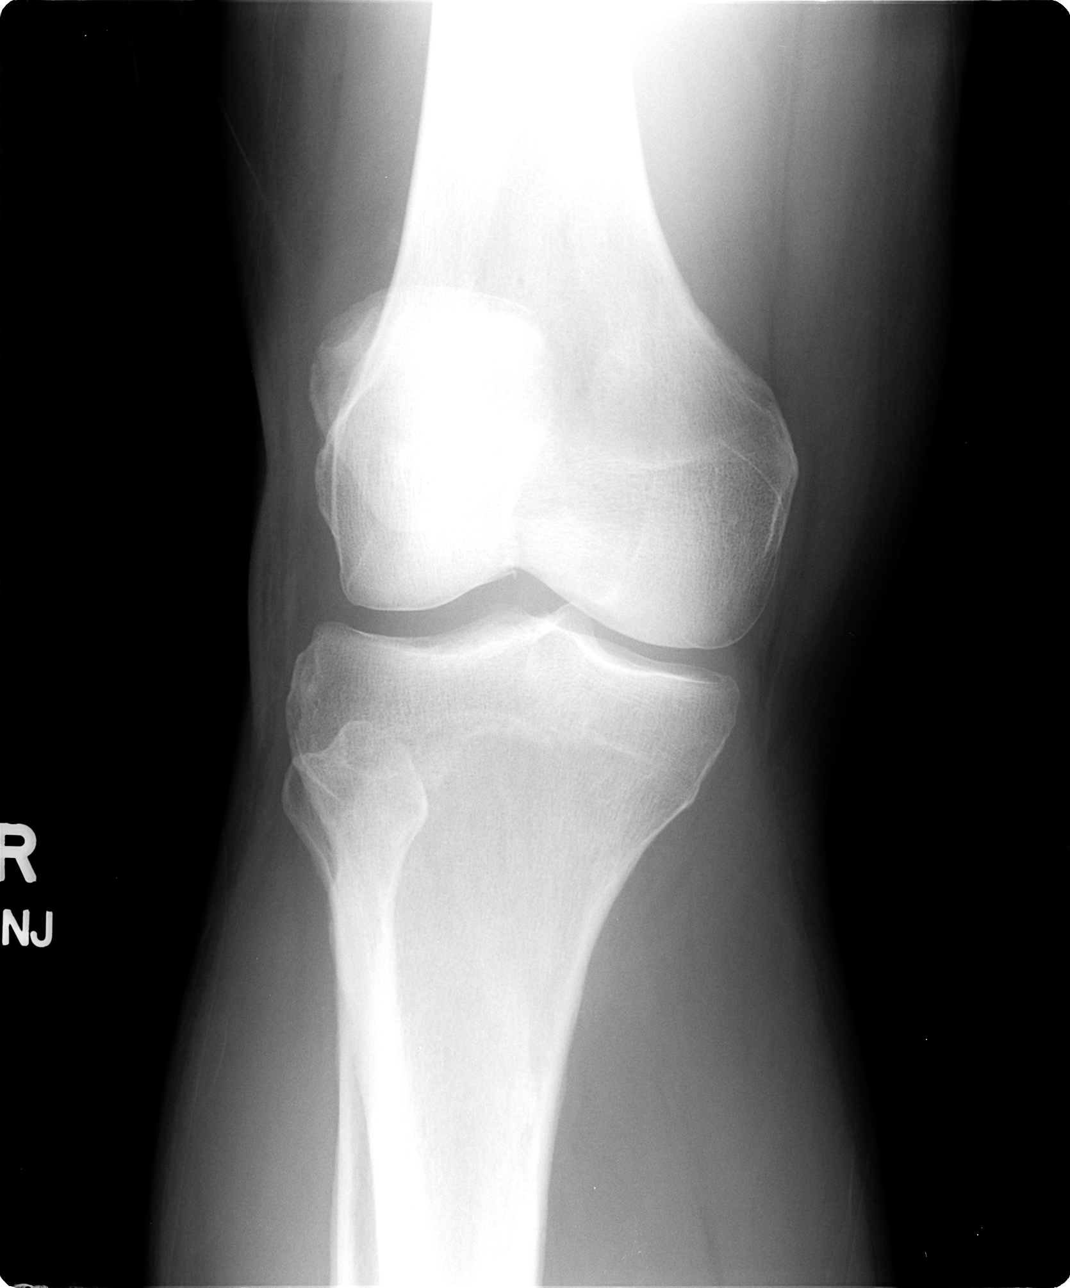

[view not recorded (3 of 4)]
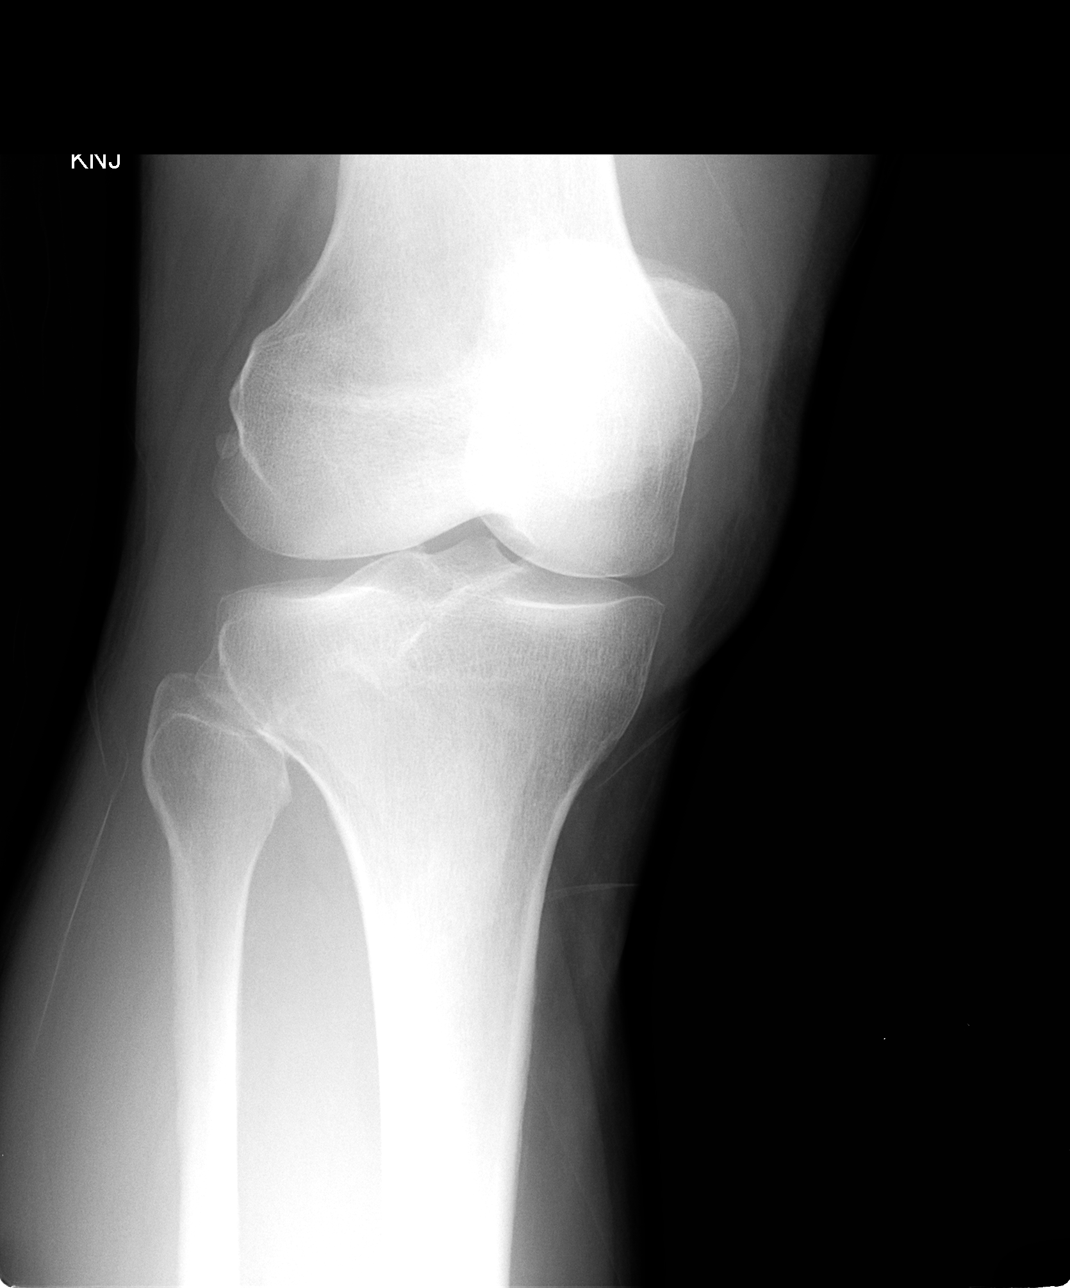

[view not recorded (4 of 4)]
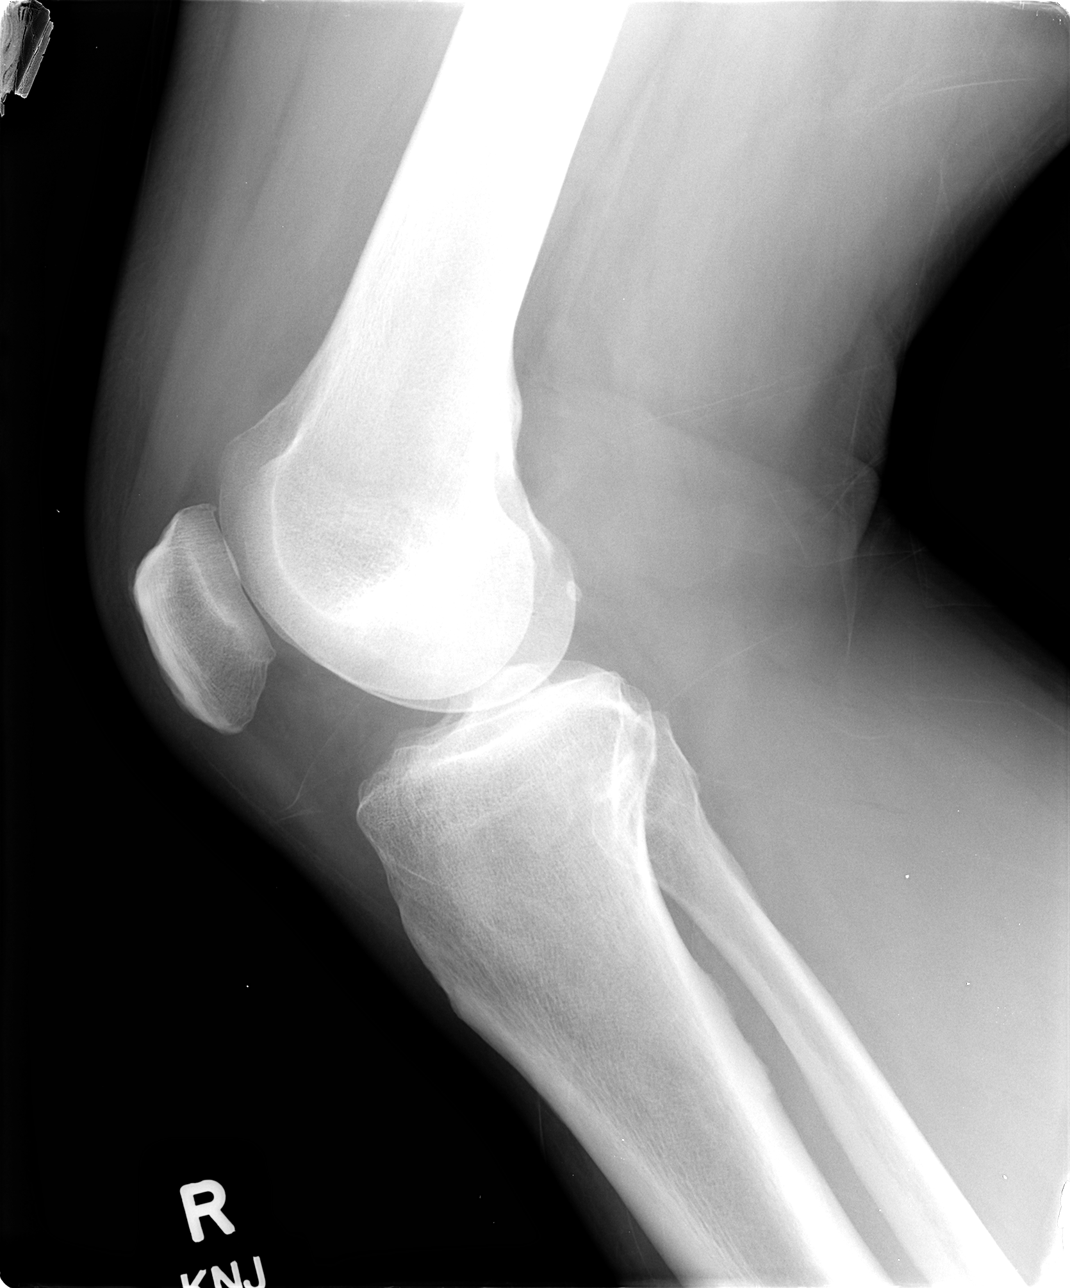

[4 of 4 positions shown; findings below may reference images not displayed]

FINDINGS: Four views of the right knee submitted.  No acute
fracture or subluxation.  Joint spaces preserved.  Minimal
narrowing of patellofemoral joint space.  No joint effusion.
IMPRESSION: No acute fracture or subluxation.  Minimal narrowing of
patellofemoral joint space.

## 2014-06-15 ENCOUNTER — Telehealth: Payer: Self-pay | Admitting: *Deleted

## 2014-06-15 NOTE — Telephone Encounter (Signed)
[facility] - Client TELEPHONE ADVICE RECORD [facility] Patient Name: Robert Fuentes Gender: Male DOB: 08/11/1950 Age: 64 Y 4 M 14 D Return Phone Number: Address: City/State/Zip: [place] [facility] [facility] Client Client Site [facility] - Night Physician Sanda LingerJones, Thomas Contact Type Call Call Type Triage / Clinical Caller Name Cam HaiZack Zidek Relationship To Patient Care Giver Return Phone Number Unavailable Chief Complaint Paging or Request for Consult Initial Comment Delia ChimesCaller Zack, paramedic who needs to speak with doctor about signing a death certificate. Call back number [phone] PreDisposition Call Doctor Nurse Assessment Nurse: Renaldo FiddlerAdkins, RN, Raynelle FanningJulie Date/Time Lamount Cohen(Eastern Time): 10/04/2014 8:36:48 AM Confirm and document reason for call. If symptomatic, describe symptoms. ---Caller is Zack, paramedic at this pt's home. He has passed away unexpectedly and he needs to speak to the on call practitioner regarding the death certificate. Has the patient traveled out of the country within the last 30 days? ---Not Applicable Does the patient require triage? ---No Please document clinical information provided and list any resource used. ---Advised that I would contact the on call practitioner now. Verbalized understanding. Guidelines Guideline Title Affirmed Question Affirmed Notes Nurse Date/Time Lamount Cohen(Eastern Time) PCP Call - No Triage [1] Follow-up call from patient regarding patient's clinical status AND [2] information urgent death Ebony Cargodkins, RN, Julie 10/04/2014 8:43:42 AM Disp. Time Lamount Cohen(Eastern Time) Disposition Final User 10/04/2014 8:34:10 AM Send to Urgent Queue Toppins, Dava 10/04/2014 8:42:28 AM Called On-Call Provider Renaldo FiddlerAdkins, RN, Raynelle FanningJulie 10/04/2014 8:44:34 AM Call Completed Renaldo FiddlerAdkins, RN, Raynelle FanningJulie 10/04/2014 8:44:10 AM Call PCP Now Yes Renaldo FiddlerAdkins, RN, Lynita LombardJulie Caller Understands: Yes Disagree/Comply: Comply PLEASE NOTE: All  timestamps contained within this report are represented as [place] Standard Time. CONFIDENTIALTY NOTICE: This fax transmission is intended only for the addressee. It contains information that is legally privileged, confidential or otherwise protected from use or disclosure. If you are not the intended recipient, you are strictly prohibited from reviewing, disclosing, copying using or disseminating any of this information or taking any action in reliance on or regarding this information. If you have received this fax in error, please notify us immediately by telephone so that we can arrange for its return to us. Phone: [phone], Toll-Free: [phone], Fax: [phone] Page: 2 of 2 Call Id: [id number] Care Advice Given Per Guideline CALL PCP NOW: You need to discuss this with your doctor. I'll page him now. If you haven't heard from the on-call doctor within 30 minutes, call again. CARE ADVICE given per PCP Call - No Triage (Adult) guideline. After Care Instructions Given Call Event Type User Date / Time Description Paging DoctorName DoctorPhone DateTime Result/Outcome Notes Roxy Mannsower, [place] [phone] 10/04/2014 8:42:28 AM Called On Call Provider - [address] 10/04/2014 8:43:00 AM Spoke with On Call - General Spoke to Dr.

## 2014-06-16 ENCOUNTER — Telehealth: Payer: Self-pay | Admitting: Internal Medicine

## 2014-06-16 NOTE — Telephone Encounter (Signed)
Rec'd Death Certificate for Dr. Sanda Lingerhomas Jones to sign 06/15/2014. Faxed to [facility] [employer] on 06/16/2014

## 2014-06-28 DEATH — deceased
